# Patient Record
Sex: Female | Born: 1985 | Race: Black or African American | Hispanic: No | Marital: Single | Smoking: Never smoker
Health system: Southern US, Community
[De-identification: ages and names within clinical notes are randomized; demographics above are authoritative.]

## PROBLEM LIST (undated history)

## (undated) HISTORY — PX: CERVICAL BIOPSY  W/ LOOP ELECTRODE EXCISION: SUR135

---

## 2009-03-08 ENCOUNTER — Ambulatory Visit (HOSPITAL_COMMUNITY): Admission: RE | Admit: 2009-03-08 | Discharge: 2009-03-08 | Payer: Self-pay | Admitting: Obstetrics & Gynecology

## 2009-06-12 ENCOUNTER — Inpatient Hospital Stay (HOSPITAL_COMMUNITY): Admission: AD | Admit: 2009-06-12 | Discharge: 2009-06-12 | Payer: Self-pay | Admitting: Obstetrics & Gynecology

## 2009-07-31 ENCOUNTER — Inpatient Hospital Stay (HOSPITAL_COMMUNITY): Admission: AD | Admit: 2009-07-31 | Discharge: 2009-07-31 | Payer: Self-pay | Admitting: Obstetrics & Gynecology

## 2009-08-07 ENCOUNTER — Inpatient Hospital Stay (HOSPITAL_COMMUNITY): Admission: AD | Admit: 2009-08-07 | Discharge: 2009-08-11 | Payer: Self-pay | Admitting: Obstetrics & Gynecology

## 2009-08-08 ENCOUNTER — Encounter: Payer: Self-pay | Admitting: Obstetrics

## 2010-01-12 IMAGING — US US OB DETAIL+14 WK
1 series · 9 of 9 positions shown · non-contrast
Comparison: none

OBSTETRICAL ULTRASOUND:
 This ultrasound exam was performed in the [HOSPITAL] Ultrasound Department.  The OB US report was generated in the [facility] system, and faxed to the ordering physician.  This report is also available in [REDACTED] PACS.

[Series 1: us ob detail +14 wk · 0.13mm/px · 9 of 9 slices shown]
[im 1/9]
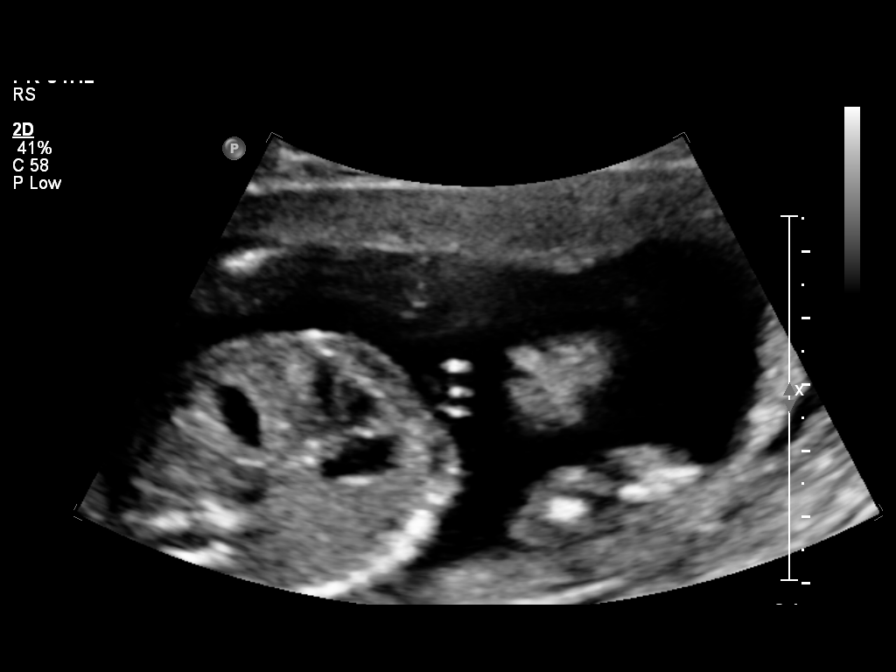
[im 2/9]
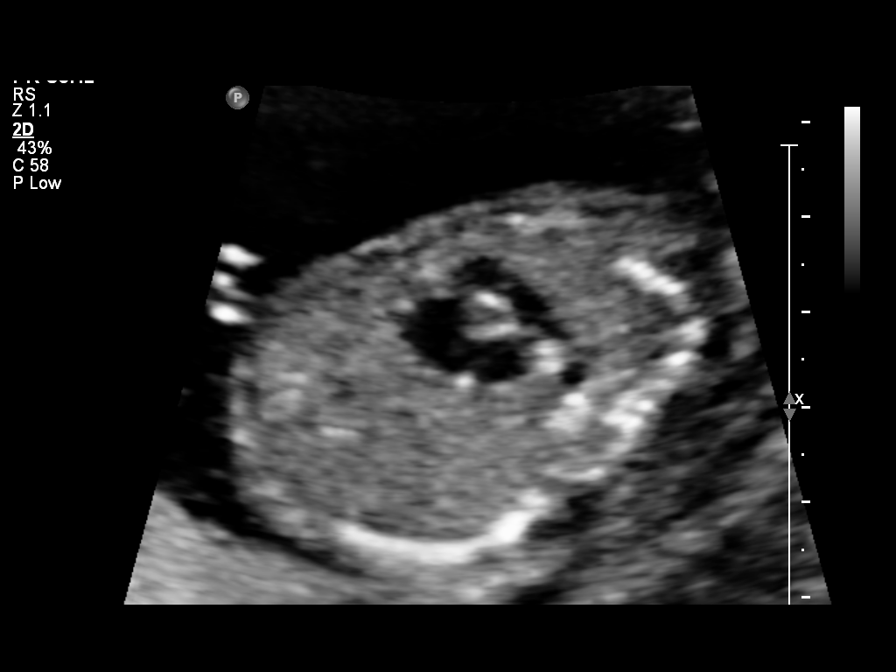
[im 3/9]
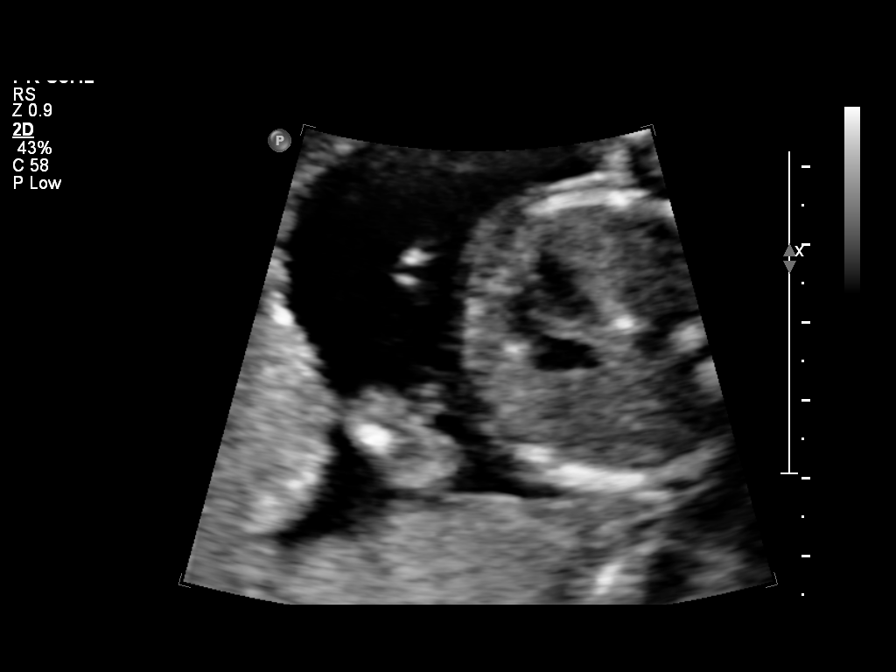
[im 4/9]
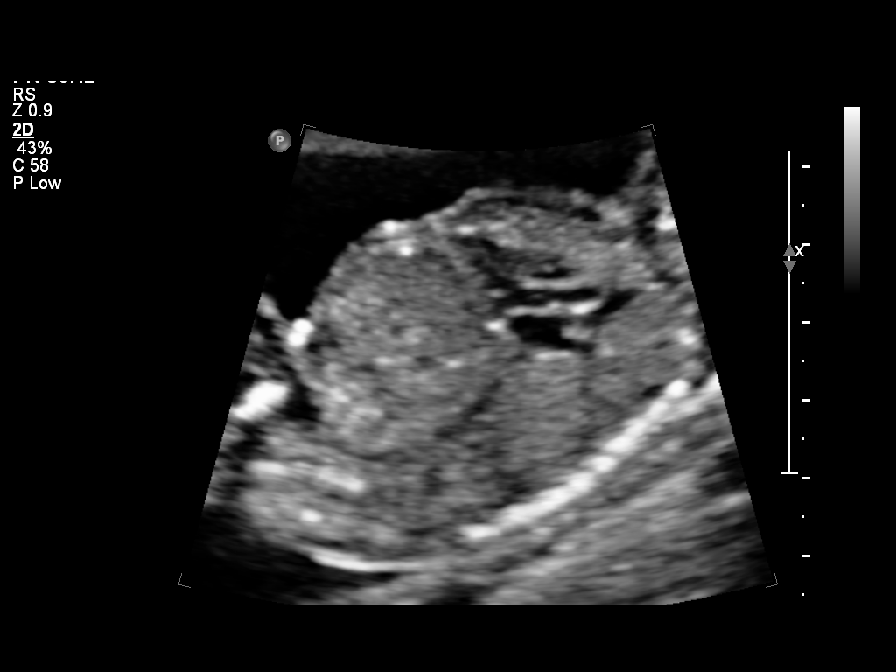
[im 5/9]
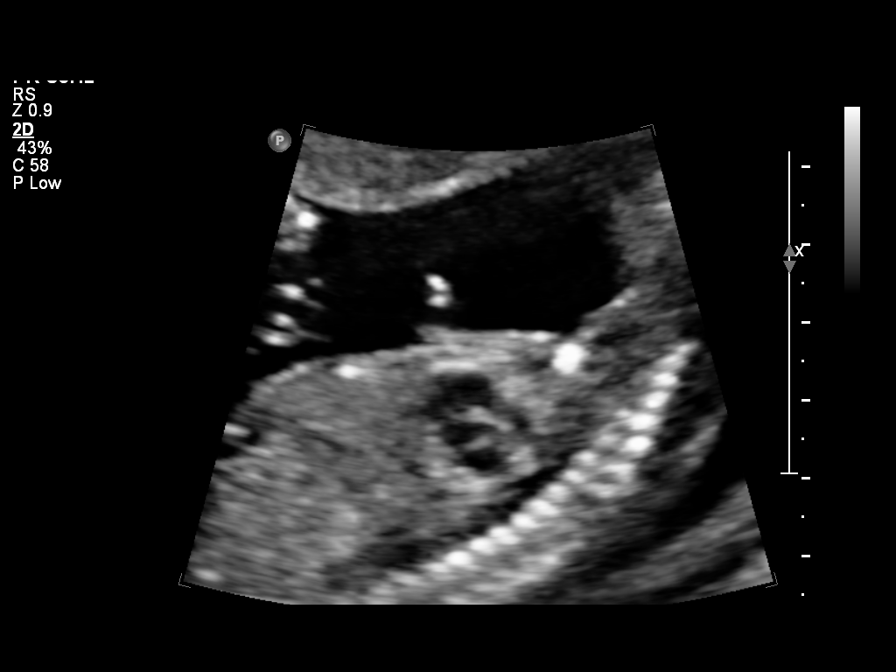
[im 6/9]
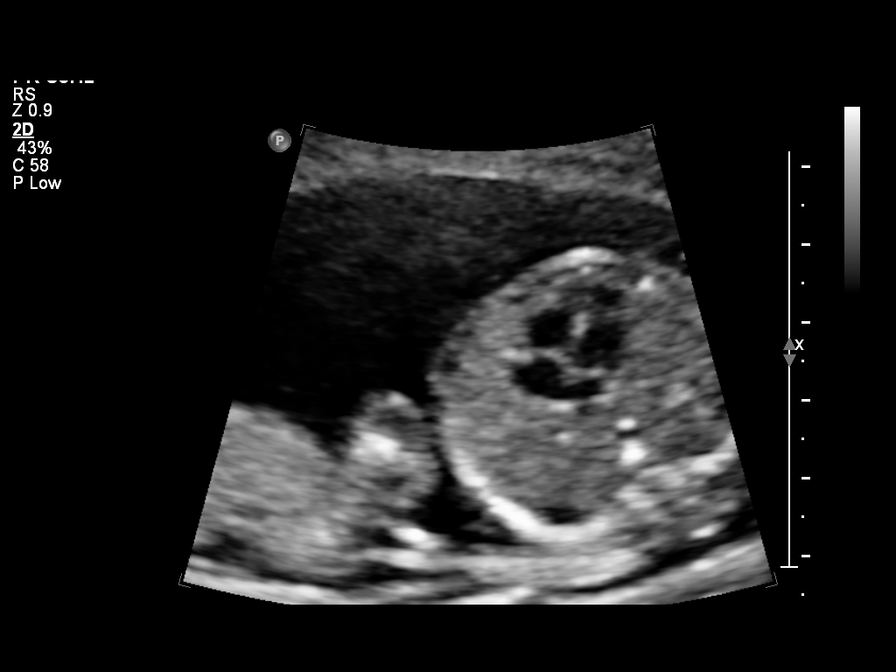
[im 7/9]
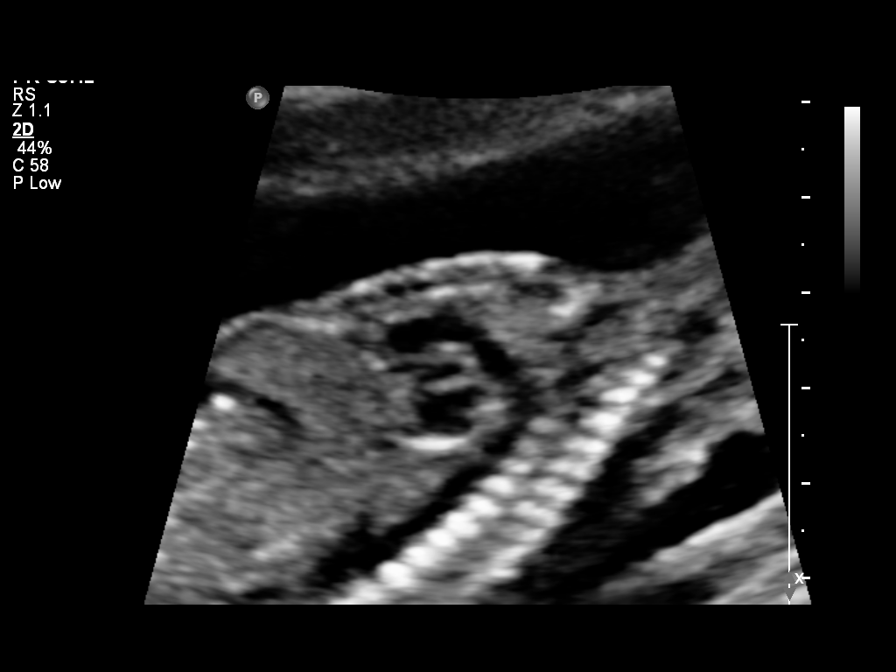
[im 8/9]
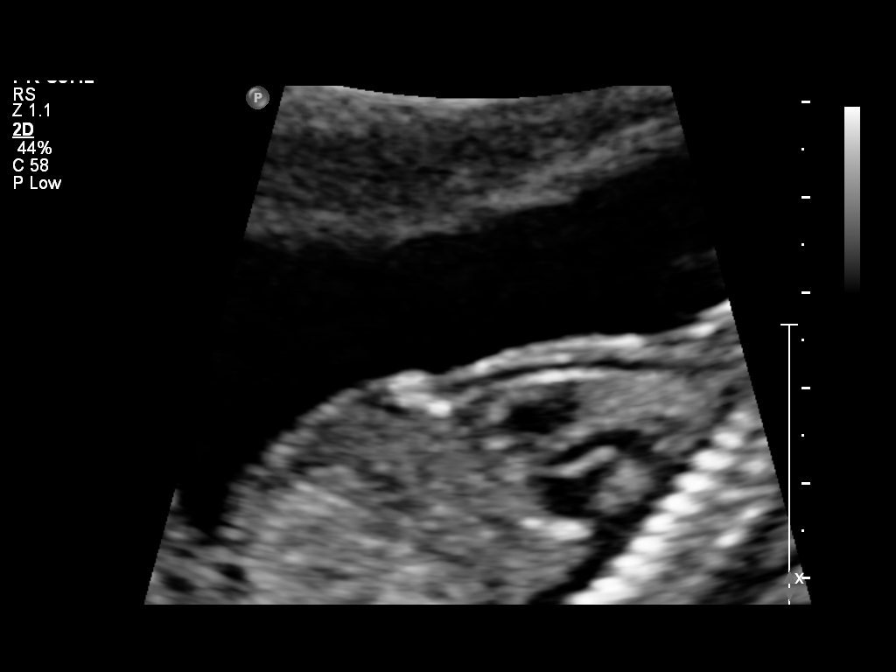
[im 9/9]
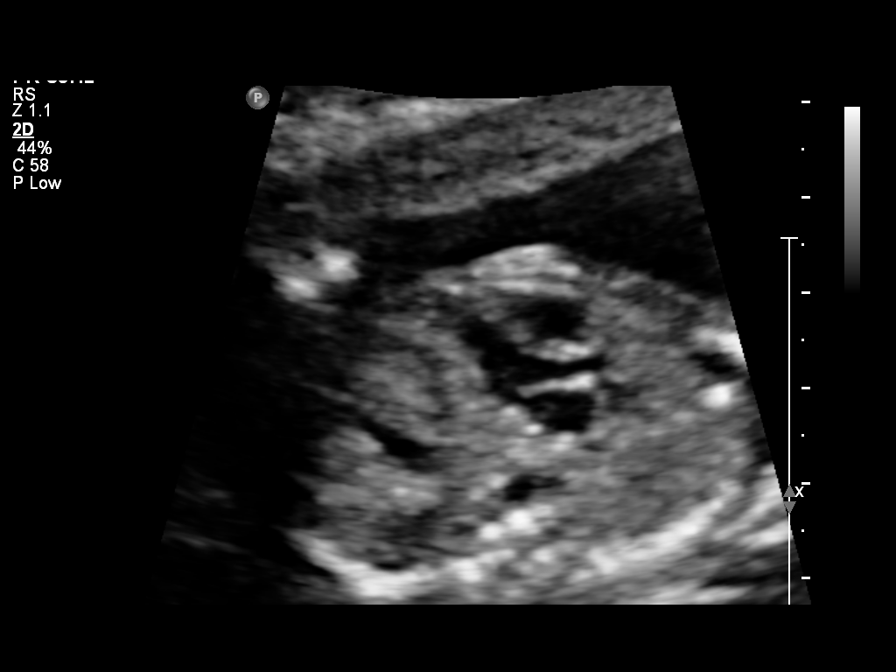

[9 of 9 positions shown; findings below may reference images not displayed]

IMPRESSION: See [facility] Obstetric US report.

## 2010-06-30 ENCOUNTER — Emergency Department (HOSPITAL_BASED_OUTPATIENT_CLINIC_OR_DEPARTMENT_OTHER)
Admission: EM | Admit: 2010-06-30 | Discharge: 2010-06-30 | Payer: Self-pay | Source: Home / Self Care | Admitting: Emergency Medicine

## 2010-10-01 LAB — DIFFERENTIAL
Basophils Absolute: 0.1 10*3/uL (ref 0.0–0.1)
Lymphs Abs: 1.9 10*3/uL (ref 0.7–4.0)
Monocytes Absolute: 0.5 10*3/uL (ref 0.1–1.0)

## 2010-10-01 LAB — CBC
MCH: 23.5 pg — ABNORMAL LOW (ref 26.0–34.0)
MCV: 70.3 fL — ABNORMAL LOW (ref 78.0–100.0)
Platelets: 324 10*3/uL (ref 150–400)
RDW: 14.4 % (ref 11.5–15.5)
WBC: 4.9 10*3/uL (ref 4.0–10.5)

## 2010-10-01 LAB — URINE CULTURE
Colony Count: NO GROWTH
Culture: NO GROWTH

## 2010-10-01 LAB — BASIC METABOLIC PANEL
BUN: 7 mg/dL (ref 6–23)
CO2: 25 mEq/L (ref 19–32)
Chloride: 107 mEq/L (ref 96–112)
Creatinine, Ser: 0.8 mg/dL (ref 0.4–1.2)

## 2010-10-01 LAB — URINALYSIS, ROUTINE W REFLEX MICROSCOPIC
Bilirubin Urine: NEGATIVE
Protein, ur: NEGATIVE mg/dL
Urobilinogen, UA: 0.2 mg/dL (ref 0.0–1.0)

## 2010-10-06 LAB — CBC
Hemoglobin: 11.5 g/dL — ABNORMAL LOW (ref 12.0–15.0)
MCHC: 32.5 g/dL (ref 30.0–36.0)
MCV: 78.4 fL (ref 78.0–100.0)
Platelets: 181 10*3/uL (ref 150–400)
RDW: 14.9 % (ref 11.5–15.5)
RDW: 15.2 % (ref 11.5–15.5)
WBC: 12.7 10*3/uL — ABNORMAL HIGH (ref 4.0–10.5)

## 2010-10-06 LAB — RPR: RPR Ser Ql: NONREACTIVE

## 2010-10-23 LAB — URINALYSIS, ROUTINE W REFLEX MICROSCOPIC
Ketones, ur: NEGATIVE mg/dL
Nitrite: NEGATIVE
Protein, ur: NEGATIVE mg/dL
Urobilinogen, UA: 1 mg/dL (ref 0.0–1.0)
pH: 7 (ref 5.0–8.0)

## 2010-10-23 LAB — URINE MICROSCOPIC-ADD ON

## 2010-10-23 LAB — WET PREP, GENITAL

## 2011-05-06 IMAGING — CT CT HEAD W/O CM
1 series · 16 of 30 positions shown, 20 images · non-contrast
Comparison: None.

CLINICAL DATA: Dizziness.  Cold.

CT HEAD WITHOUT CONTRAST
TECHNIQUE: Contiguous axial images were obtained from the base of
the skull through the vertex without contrast.

[Series 2: head 4.8 h37s · axial · 0.40mm/px · z∈[-161,-28]mm · 16 of 32 slices shown, 20 images]
[im 2/32  brain]
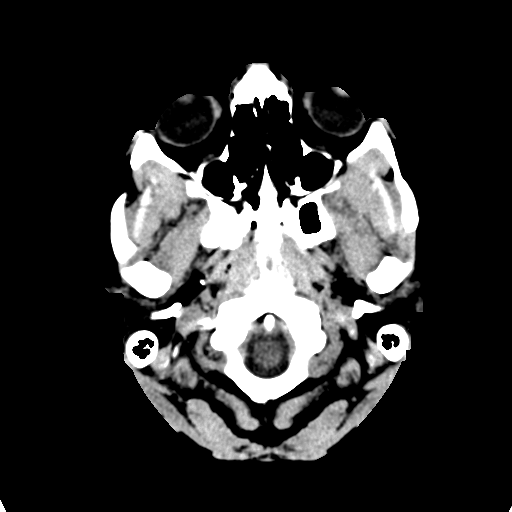
[im 2/32  bone]
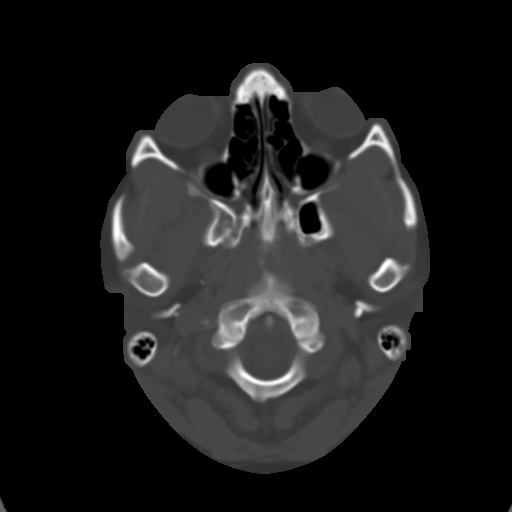
[im 4/32  brain]
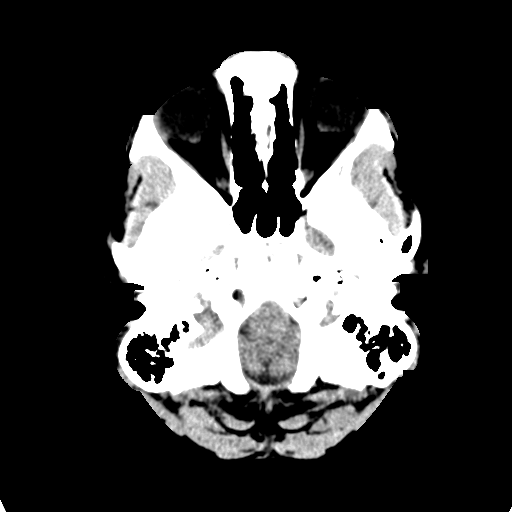
[im 6/32  brain]
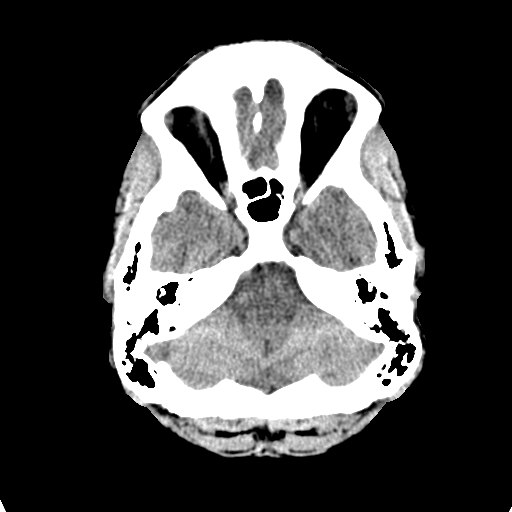
[im 8/32  brain]
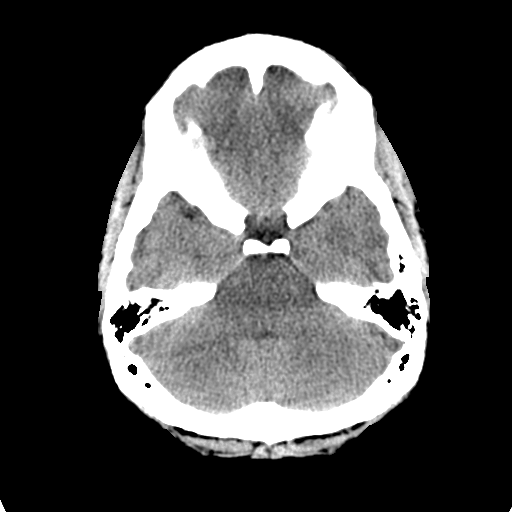
[im 9/32  brain]
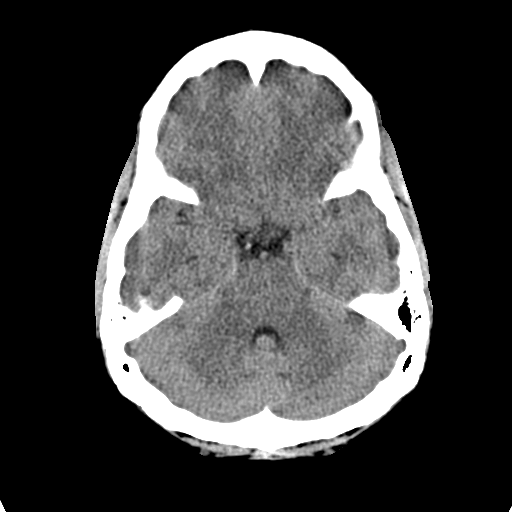
[im 9/32  bone]
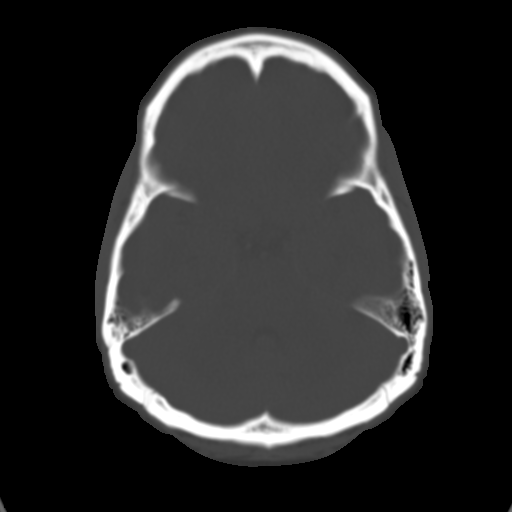
[im 11/32  brain]
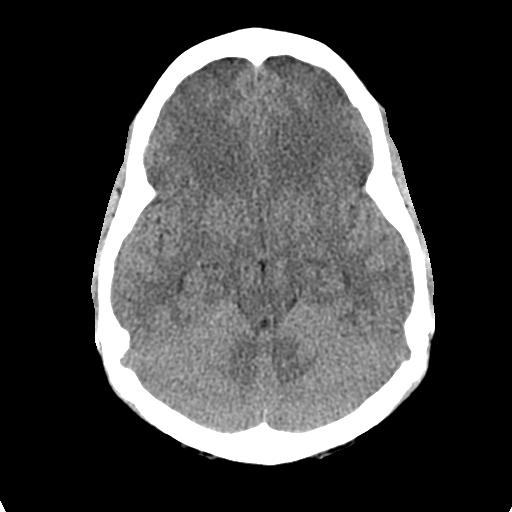
[im 13/32  brain]
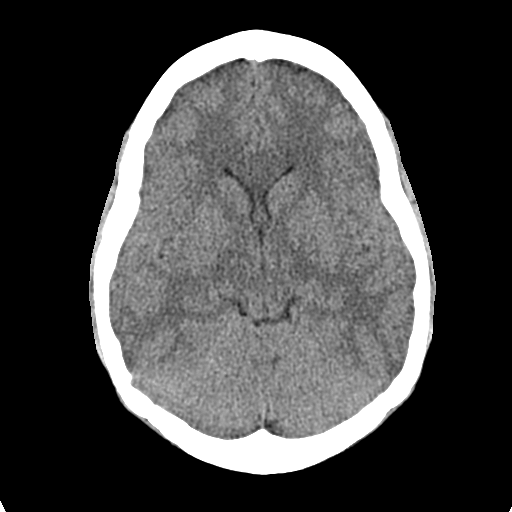
[im 15/32  brain]
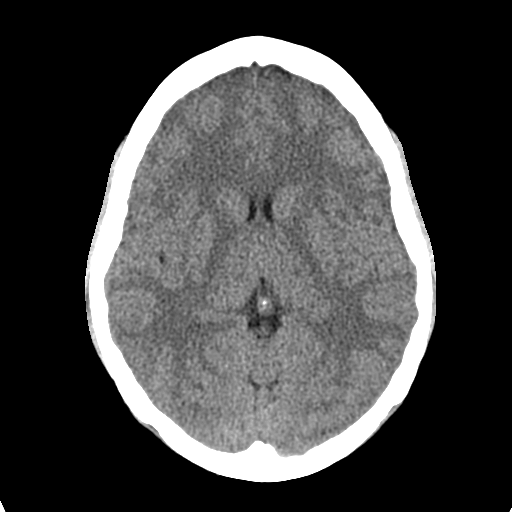
[im 17/32  brain]
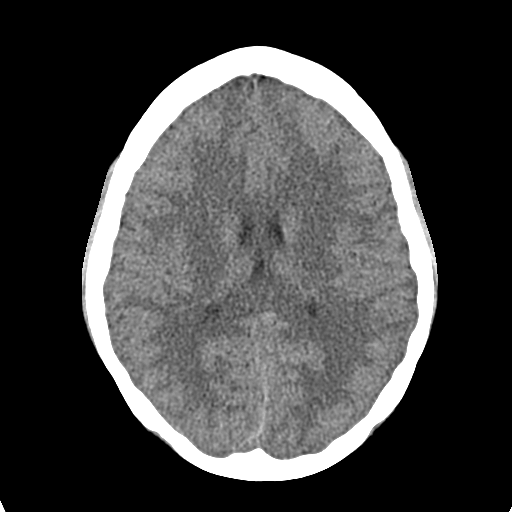
[im 17/32  bone]
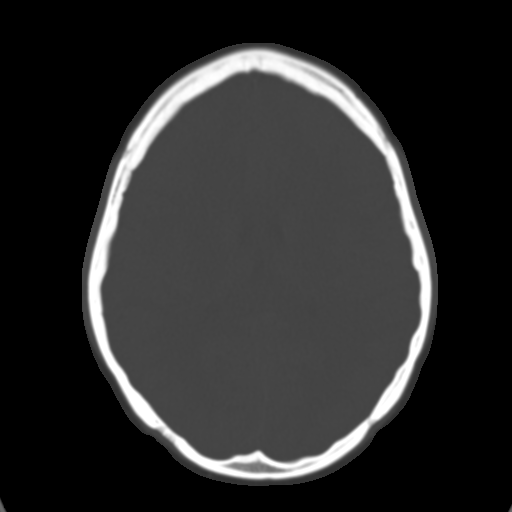
[im 19/32  brain]
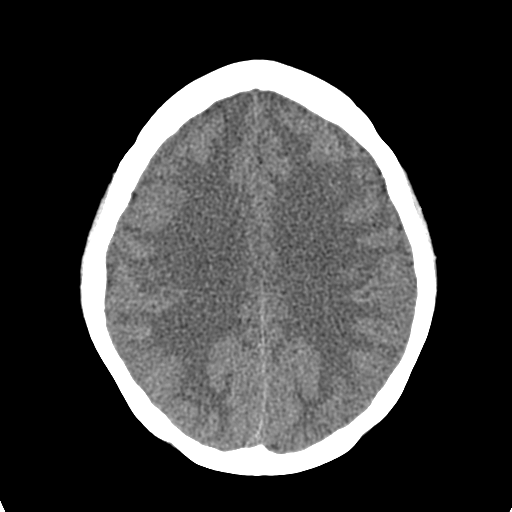
[im 21/32  brain]
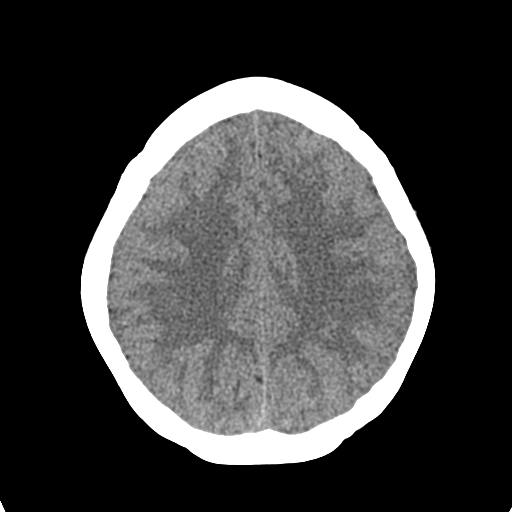
[im 23/32  brain]
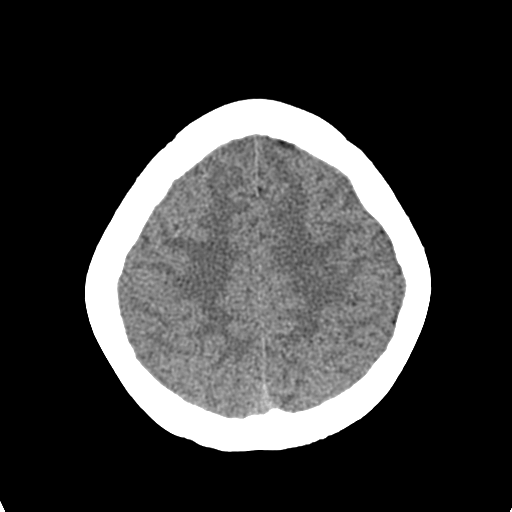
[im 24/32  brain]
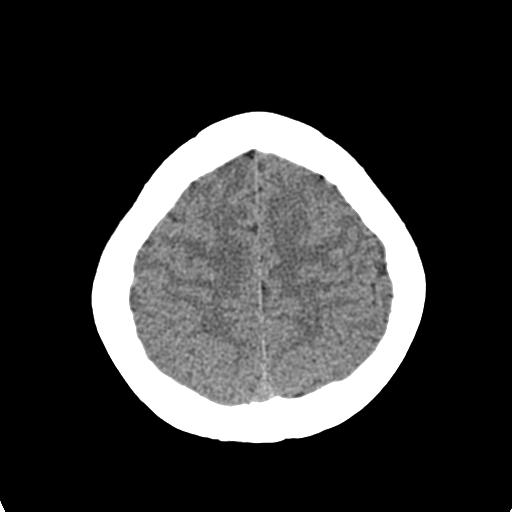
[im 24/32  bone]
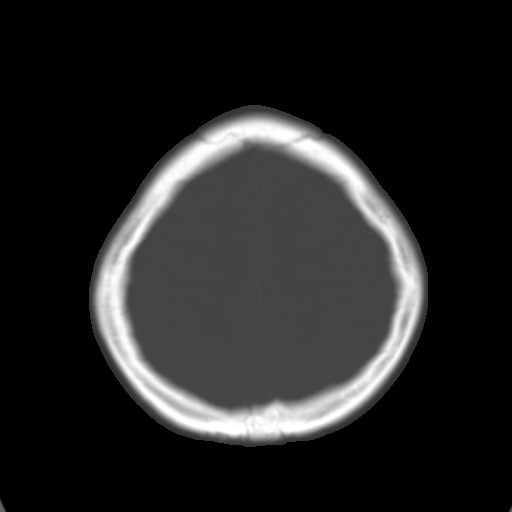
[im 26/32  brain]
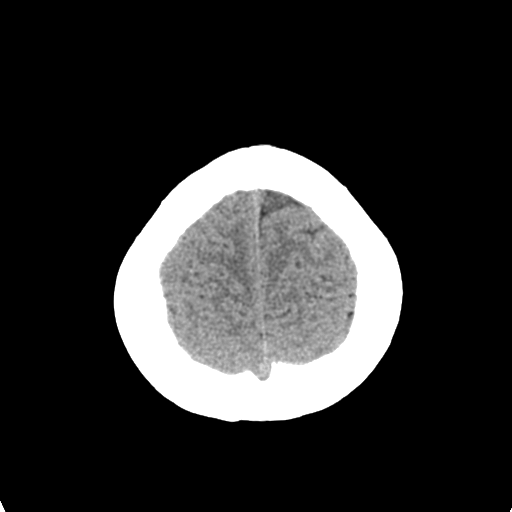
[im 28/32  brain]
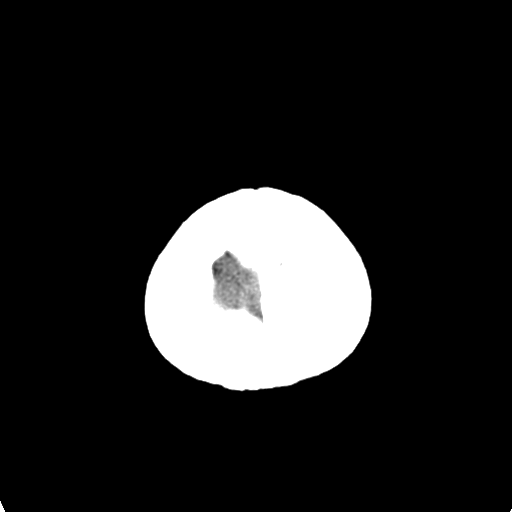
[im 30/32  brain]
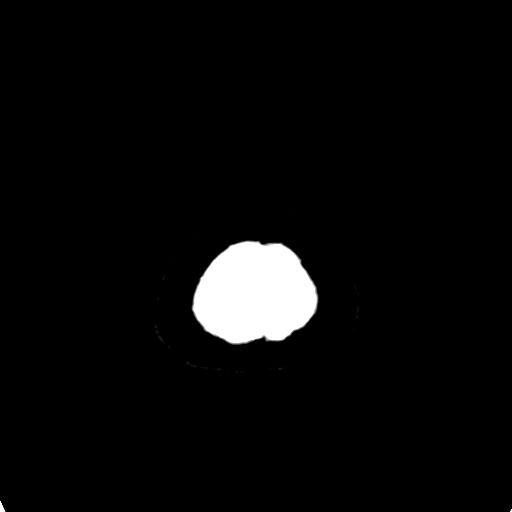

[16 of 30 positions shown; findings below may reference images not displayed]

FINDINGS: No mass lesion, mass effect, midline shift,
hydrocephalus, hemorrhage.  No territorial ischemia or acute
infarction.  Paranasal sinuses are normal.
IMPRESSION: Negative CT head.

## 2013-03-24 ENCOUNTER — Ambulatory Visit (INDEPENDENT_AMBULATORY_CARE_PROVIDER_SITE_OTHER): Payer: BC Managed Care – PPO | Admitting: Obstetrics & Gynecology

## 2013-03-24 ENCOUNTER — Encounter: Payer: Self-pay | Admitting: Obstetrics & Gynecology

## 2013-03-24 VITALS — BP 131/91 | HR 84 | Temp 98.4°F | Ht 62.0 in | Wt 198.6 lb

## 2013-03-24 DIAGNOSIS — Z3202 Encounter for pregnancy test, result negative: Secondary | ICD-10-CM

## 2013-03-24 DIAGNOSIS — R102 Pelvic and perineal pain: Secondary | ICD-10-CM

## 2013-03-24 DIAGNOSIS — N83209 Unspecified ovarian cyst, unspecified side: Secondary | ICD-10-CM

## 2013-03-24 LAB — URINALYSIS, ROUTINE W REFLEX MICROSCOPIC
Bilirubin Urine: NEGATIVE
Glucose, UA: NEGATIVE mg/dL
Ketones, ur: NEGATIVE mg/dL
Specific Gravity, Urine: 1.014 (ref 1.005–1.030)
pH: 7 (ref 5.0–8.0)

## 2013-03-24 LAB — POCT URINE PREGNANCY: Preg Test, Ur: NEGATIVE

## 2013-03-24 NOTE — Progress Notes (Signed)
.   Subjective:     Whitney Rivas is a 27 y.o. female here for a problem exam.  Current complaints: She had her Mirena inserted about two or three years ago and has had lower pelvic pain for the last three weeks.  She is concerned that it is her Mirena. .  Personal health questionnaire reviewed: yes.   Gynecologic History No LMP recorded. Patient is not currently having periods (Reason: IUD). Contraception: IUD Last Pap: 2014. Results were: normal Last mammogram: N/A  Obstetric History OB History  No data available     The following portions of the patient's history were reviewed and updated as appropriate: allergies, current medications, past family history, past medical history, past social history, past surgical history and problem list.  Review of Systems Pertinent items are noted in HPI.    Objective:   General:  alert     Abdomen: Suprapubic tenderness   Vulva:  normal  Vagina: normal vagina  Cervix:  no lesions  Corpus: normal size, contour, position, consistency, mobility, non-tender  Adnexa:  normal adnexa   Informal U/S by me including 3D--IUD in good position. 3 cm unilocular cyst anterior to the uterine fundus Assessment:   Likely functional ovarian cyst  Plan:   Repeat U/S in 2 mths--f/u prn or after the U/S

## 2013-03-25 ENCOUNTER — Encounter: Payer: Self-pay | Admitting: Obstetrics & Gynecology

## 2013-03-25 LAB — WET PREP BY MOLECULAR PROBE
Candida species: NEGATIVE
Trichomonas vaginosis: NEGATIVE

## 2013-03-25 LAB — URINALYSIS, MICROSCOPIC ONLY
Bacteria, UA: NONE SEEN
Casts: NONE SEEN
Crystals: NONE SEEN

## 2013-03-27 ENCOUNTER — Encounter: Payer: Self-pay | Admitting: Obstetrics & Gynecology

## 2013-03-27 NOTE — Patient Instructions (Signed)
Ovarian Cyst  The ovaries are small organs that are on each side of the uterus. The ovaries are the organs that produce the female hormones, estrogen and progesterone. An ovarian cyst is a sac filled with fluid that can vary in its size. It is normal for a small cyst to form in women who are in the childbearing age and who have menstrual periods. This type of cyst is called a follicle cyst that becomes an ovulation cyst (corpus luteum cyst) after it produces the women's egg. It later goes away on its own if the woman does not become pregnant. There are other kinds of ovarian cysts that may cause problems and may need to be treated. The most serious problem is a cyst with cancer. It should be noted that menopausal women who have an ovarian cyst are at a higher risk of it being a cancer cyst. They should be evaluated very quickly, thoroughly and followed closely. This is especially true in menopausal women because of the high rate of ovarian cancer in women in menopause.  CAUSES AND TYPES OF OVARIAN CYSTS:   FUNCTIONAL CYST: The follicle/corpus luteum cyst is a functional cyst that occurs every month during ovulation with the menstrual cycle. They go away with the next menstrual cycle if the woman does not get pregnant. Usually, there are no symptoms with a functional cyst.   ENDOMETRIOMA CYST: This cyst develops from the lining of the uterus tissue. This cyst gets in or on the ovary. It grows every month from the bleeding during the menstrual period. It is also called a "chocolate cyst" because it becomes filled with blood that turns brown. This cyst can cause pain in the lower abdomen during intercourse and with your menstrual period.   CYSTADENOMA CYST: This cyst develops from the cells on the outside of the ovary. They usually are not cancerous. They can get very big and cause lower abdomen pain and pain with intercourse. This type of cyst can twist on itself, cut off its blood supply and cause severe pain. It  also can easily rupture and cause a lot of pain.   DERMOID CYST: This type of cyst is sometimes found in both ovaries. They are found to have different kinds of body tissue in the cyst. The tissue includes skin, teeth, hair, and/or cartilage. They usually do not have symptoms unless they get very big. Dermoid cysts are rarely cancerous.   POLYCYSTIC OVARY: This is a rare condition with hormone problems that produces many small cysts on both ovaries. The cysts are follicle-like cysts that never produce an egg and become a corpus luteum. It can cause an increase in body weight, infertility, acne, increase in body and facial hair and lack of menstrual periods or rare menstrual periods. Many women with this problem develop type 2 diabetes. The exact cause of this problem is unknown. A polycystic ovary is rarely cancerous.   THECA LUTEIN CYST: Occurs when too much hormone (human chorionic gonadotropin) is produced and over-stimulates the ovaries to produce an egg. They are frequently seen when doctors stimulate the ovaries for invitro-fertilization (test tube babies).   LUTEOMA CYST: This cyst is seen during pregnancy. Rarely it can cause an obstruction to the birth canal during labor and delivery. They usually go away after delivery.  SYMPTOMS    Pelvic pain or pressure.   Pain during sexual intercourse.   Increasing girth (swelling) of the abdomen.   Abnormal menstrual periods.   Increasing pain with menstrual periods.     You stop having menstrual periods and you are not pregnant.  DIAGNOSIS   The diagnosis can be made during:   Routine or annual pelvic examination (common).   Ultrasound.   X-ray of the pelvis.   CT Scan.   MRI.   Blood tests.  TREATMENT    Treatment may only be to follow the cyst monthly for 2 to 3 months with your caregiver. Many go away on their own, especially functional cysts.   May be aspirated (drained) with a long needle with ultrasound, or by laparoscopy (inserting a tube into  the pelvis through a small incision).   The whole cyst can be removed by laparoscopy.   Sometimes the cyst may need to be removed through an incision in the lower abdomen.   Hormone treatment is sometimes used to help dissolve certain cysts.   Birth control pills are sometimes used to help dissolve certain cysts.  HOME CARE INSTRUCTIONS   Follow your caregiver's advice regarding:   Medicine.   Follow up visits to evaluate and treat the cyst.   You may need to come back or make an appointment with another caregiver, to find the exact cause of your cyst, if your caregiver is not a gynecologist.   Get your yearly and recommended pelvic examinations and Pap tests.   Let your caregiver know if you have had an ovarian cyst in the past.  SEEK MEDICAL CARE IF:    Your periods are late, irregular, they stop, or are painful.   Your stomach (abdomen) or pelvic pain does not go away.   Your stomach becomes larger or swollen.   You have pressure on your bladder or trouble emptying your bladder completely.   You have painful sexual intercourse.   You have feelings of fullness, pressure, or discomfort in your stomach.   You lose weight for no apparent reason.   You feel generally ill.   You become constipated.   You lose your appetite.   You develop acne.   You have an increase in body and facial hair.   You are gaining weight, without changing your exercise and eating habits.   You think you are pregnant.  SEEK IMMEDIATE MEDICAL CARE IF:    You have increasing abdominal pain.   You feel sick to your stomach (nausea) and/or vomit.   You develop a fever that comes on suddenly.   You develop abdominal pain during a bowel movement.   Your menstrual periods become heavier than usual.  Document Released: 07/07/2005 Document Revised: 09/29/2011 Document Reviewed: 05/10/2009  [employer] Patient Information 2014 [employer]

## 2014-10-10 ENCOUNTER — Telehealth: Payer: Self-pay | Admitting: *Deleted

## 2014-10-10 NOTE — Telephone Encounter (Signed)
Patient is calling with questions about her birth control- She has Mirena with a lot of cramping. 11:20 LM on VM- call for appointment- Friday am if possible.
